# Patient Record
Sex: Female | Born: 1965 | Race: White | Hispanic: No | Marital: Married | Smoking: Never smoker
Health system: Southern US, Community
[De-identification: ages and names within clinical notes are randomized; demographics above are authoritative.]

## PROBLEM LIST (undated history)

## (undated) DIAGNOSIS — M2550 Pain in unspecified joint: Secondary | ICD-10-CM

## (undated) DIAGNOSIS — M949 Disorder of cartilage, unspecified: Secondary | ICD-10-CM

## (undated) DIAGNOSIS — R42 Dizziness and giddiness: Secondary | ICD-10-CM

## (undated) DIAGNOSIS — M899 Disorder of bone, unspecified: Secondary | ICD-10-CM

## (undated) DIAGNOSIS — Z1589 Genetic susceptibility to other disease: Secondary | ICD-10-CM

## (undated) DIAGNOSIS — R319 Hematuria, unspecified: Secondary | ICD-10-CM

## (undated) DIAGNOSIS — M1990 Unspecified osteoarthritis, unspecified site: Secondary | ICD-10-CM

## (undated) DIAGNOSIS — M8580 Other specified disorders of bone density and structure, unspecified site: Secondary | ICD-10-CM

## (undated) DIAGNOSIS — R945 Abnormal results of liver function studies: Secondary | ICD-10-CM

## (undated) DIAGNOSIS — Z8619 Personal history of other infectious and parasitic diseases: Secondary | ICD-10-CM

## (undated) DIAGNOSIS — H1870 Unspecified corneal deformity: Secondary | ICD-10-CM

## (undated) DIAGNOSIS — D72829 Elevated white blood cell count, unspecified: Secondary | ICD-10-CM

## (undated) DIAGNOSIS — K589 Irritable bowel syndrome without diarrhea: Secondary | ICD-10-CM

## (undated) DIAGNOSIS — M459 Ankylosing spondylitis of unspecified sites in spine: Secondary | ICD-10-CM

## (undated) DIAGNOSIS — M359 Systemic involvement of connective tissue, unspecified: Secondary | ICD-10-CM

## (undated) HISTORY — DX: Disorder of bone, unspecified: M89.9

## (undated) HISTORY — DX: Dizziness and giddiness: R42

## (undated) HISTORY — DX: Other specified disorders of bone density and structure, unspecified site: M85.80

## (undated) HISTORY — DX: Abnormal results of liver function studies: R94.5

## (undated) HISTORY — DX: Personal history of other infectious and parasitic diseases: Z86.19

## (undated) HISTORY — DX: Pain in unspecified joint: M25.50

## (undated) HISTORY — DX: Unspecified corneal deformity: H18.70

## (undated) HISTORY — DX: Unspecified osteoarthritis, unspecified site: M19.90

## (undated) HISTORY — DX: Disorder of cartilage, unspecified: M94.9

## (undated) HISTORY — DX: Elevated white blood cell count, unspecified: D72.829

## (undated) HISTORY — DX: Hematuria, unspecified: R31.9

## (undated) HISTORY — DX: Irritable bowel syndrome without diarrhea: K58.9

## (undated) HISTORY — DX: Genetic susceptibility to other disease: Z15.89

## (undated) HISTORY — DX: Ankylosing spondylitis of unspecified sites in spine: M45.9

---
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
## 1993-06-27 HISTORY — PX: GALLBLADDER SURGERY: SHX652

## 1998-09-22 ENCOUNTER — Other Ambulatory Visit: Admission: RE | Admit: 1998-09-22 | Discharge: 1998-09-22 | Payer: Self-pay | Admitting: Obstetrics and Gynecology

## 1999-10-05 ENCOUNTER — Other Ambulatory Visit: Admission: RE | Admit: 1999-10-05 | Discharge: 1999-10-05 | Payer: Self-pay | Admitting: Obstetrics and Gynecology

## 2001-06-05 ENCOUNTER — Other Ambulatory Visit: Admission: RE | Admit: 2001-06-05 | Discharge: 2001-06-05 | Payer: Self-pay | Admitting: Obstetrics and Gynecology

## 2002-06-27 HISTORY — PX: VAGINAL HYSTERECTOMY: SUR661

## 2002-08-09 ENCOUNTER — Other Ambulatory Visit: Admission: RE | Admit: 2002-08-09 | Discharge: 2002-08-09 | Payer: Self-pay | Admitting: Obstetrics and Gynecology

## 2002-12-18 ENCOUNTER — Encounter (INDEPENDENT_AMBULATORY_CARE_PROVIDER_SITE_OTHER): Payer: Self-pay | Admitting: *Deleted

## 2002-12-18 ENCOUNTER — Observation Stay (HOSPITAL_COMMUNITY): Admission: RE | Admit: 2002-12-18 | Discharge: 2002-12-19 | Payer: Self-pay | Admitting: Obstetrics and Gynecology

## 2004-12-09 ENCOUNTER — Other Ambulatory Visit: Admission: RE | Admit: 2004-12-09 | Discharge: 2004-12-09 | Payer: Self-pay | Admitting: Obstetrics and Gynecology

## 2006-03-22 ENCOUNTER — Ambulatory Visit: Payer: Self-pay | Admitting: Obstetrics & Gynecology

## 2007-03-28 ENCOUNTER — Ambulatory Visit: Payer: Self-pay

## 2009-05-07 ENCOUNTER — Ambulatory Visit: Payer: Self-pay | Admitting: Family Medicine

## 2009-07-16 ENCOUNTER — Ambulatory Visit: Payer: Self-pay | Admitting: Rheumatology

## 2009-09-08 ENCOUNTER — Encounter: Payer: Self-pay | Admitting: Physician Assistant

## 2009-09-25 ENCOUNTER — Encounter: Payer: Self-pay | Admitting: Physician Assistant

## 2009-10-01 DIAGNOSIS — R945 Abnormal results of liver function studies: Secondary | ICD-10-CM

## 2009-10-01 HISTORY — DX: Abnormal results of liver function studies: R94.5

## 2009-10-25 ENCOUNTER — Encounter: Payer: Self-pay | Admitting: Physician Assistant

## 2009-11-25 ENCOUNTER — Encounter: Payer: Self-pay | Admitting: Physician Assistant

## 2009-12-25 ENCOUNTER — Encounter: Payer: Self-pay | Admitting: Physician Assistant

## 2010-01-25 ENCOUNTER — Encounter: Payer: Self-pay | Admitting: Physician Assistant

## 2010-11-12 NOTE — Discharge Summary (Signed)
   NAME:  Kendra Cameron, Kendra Cameron                       ACCOUNT NO.:  [id number]   MEDICAL RECORD NO.:  [id number]                   PATIENT TYPE:  OBV   LOCATION:  9310                                 FACILITY:  [facility]   PHYSICIAN:  Juluis Mire, M.D.                DATE OF BIRTH:  [birth year]   DATE OF ADMISSION:  12/18/2002  DATE OF DISCHARGE:  12/19/2002                                 DISCHARGE SUMMARY   ADMISSION DIAGNOSIS:  Menorrhagia and dysmenorrhea with known uterine  fibroids.   DISCHARGE DIAGNOSIS:  Menorrhagia and dysmenorrhea with known uterine  fibroids.   PATHOLOGY:  Pending.   OPERATIVE PROCEDURE:  Laparoscopically assisted vaginal hysterectomy.   HISTORY AND PHYSICAL:  For complete history and physical, please see  dictated note.   HOSPITAL COURSE:  The patient underwent above-noted surgery. There was no  evidence of pelvic pathology beyond the uterine fibroids. Pathology on the  surgical specimen is still pending. Postop hemoglobin was 9.6. Discharged  home on her first postop day. At that time, she was tolerating liquids. Her  abdomen was soft and nontender. She had normal bowel sounds. All incisions  were intact. She had no active vaginal bleeding. Urine output was adequate.   COMPLICATIONS:  None encountered during her stay in the hospital.   CONDITION ON DISCHARGE:  The patient is discharged home in stable condition.   DISCHARGE INSTRUCTIONS:  The patient is discharged home [place] in case she  needs it for pain. She is to avoid heavy lifting, vaginal entry, or driving  the car. She is to call with fever, nausea and vomiting, increasing  abdominal pain, or active vaginal bleeding.   FOLLOW UP:  In the office in one week.                                               Juluis Mire, M.D.    JSM/MEDQ  D:  12/19/2002  T:  12/21/2002  Job:  [id number]

## 2010-11-12 NOTE — H&P (Signed)
NAME:  Kendra Cameron, Kendra Cameron                       ACCOUNT NO.:  [id number]   MEDICAL RECORD NO.:  [id number]                   PATIENT TYPE:  AMB   LOCATION:  SDC                                  FACILITY:  [facility]   PHYSICIAN:  Juluis Mire, M.D.                DATE OF BIRTH:  08-05-1965   DATE OF ADMISSION:  12/18/2002  DATE OF DISCHARGE:                                HISTORY & PHYSICAL   The patient is a 45 year old, gravida 2, para 2, married white female who  presents for a laparoscopically-assisted vaginal hysterectomy.   In relation to the present admission, the patient has a longstanding history  of menstrual abnormalities.  Her cycles are regular at the present time.  She describes six days of flow, three to four days are heavy, changing pads  and tampons every two to three hours.  Associated with these periods are  clots.  She is also having increasing dysmenorrhea.  She is sexually active  with increasing pain during deep penetration.  She had been tried on birth  control pills without response.  Prior ultrasounds have been unremarkable  except for a small fibroid.  Options have been discussed, including the  further use of hormonal agents versus outpatient laparoscopy/hysteroscopy  versus vaginal hysterectomy.  The patient's plans for the present time are  for definitive surgical management.   ALLERGIES:  SULFA.   MEDICATIONS:  Include calcium.   PAST MEDICAL HISTORY:  Usual childhood diseases.  No significant sequelae.   PAST SURGICAL HISTORY:  The patient has had no previous surgical history.   OBSTETRICAL HISTORY:  Two spontaneous vaginal deliveries.   FAMILY HISTORY:  Noncontributory.   SOCIAL HISTORY:  No tobacco or alcohol use.   REVIEW OF SYSTEMS:  Noncontributory.   PHYSICAL EXAMINATION:  VITAL SIGNS:  The patient is afebrile with stable  vital signs.  HEENT EXAM:  The patient is normocephalic.  Pupils are round and reactive to  light and accommodation.   Extraocular movements are intact.  Sclerae and  conjunctivae are clear.  Oropharynx clear.  NECK:  Without thyromegaly.  BREASTS:  No discrete masses.  LUNGS:  Clear.  CARDIAC:  Regular rate and rhythm without murmurs or gallops.  ABDOMINAL EXAM:  Benign.  No mass, organomegaly or tenderness.  PELVIC:  Normal external genitalia.  Vaginal mucosa clear.  Cervix  unremarkable.  Uterus is supple and of normal size.  Adnexa unremarkable.  EXTREMITIES:  Trace edema.  NEUROLOGIC EXAM:  Grossly within normal limits.   IMPRESSION:  Menorrhagia and dysmenorrhea.  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   PLAN:  The patient will undergo laparoscopically-assisted vaginal  hysterectomy.  The risks of surgery have been discussed, including risk of  infection.  The risk of hemorrhage could necessitate transfusion with the  risk of AIDS or hepatitis.  Risk of injury to adjacent organs, including  bladder, bowel or ureters, that could require  further exploratory surgery.  Risk of deep venous thrombosis and pulmonary embolus.  The patient expressed  an understanding of the indications and risks.                                               Juluis Mire, M.D.    JSM/MEDQ  D:  12/18/2002  T:  12/18/2002  Job:  [id number]

## 2010-11-12 NOTE — Op Note (Signed)
NAME:  Kendra Cameron, Kendra Cameron                       ACCOUNT NO.:  [id number]   MEDICAL RECORD NO.:  [id number]                   PATIENT TYPE:  OBV   LOCATION:  9399                                 FACILITY:  [facility]   PHYSICIAN:  Juluis Mire, M.D.                DATE OF BIRTH:  September 05, 1965   DATE OF PROCEDURE:  12/18/2002  DATE OF DISCHARGE:                                 OPERATIVE REPORT   PREOPERATIVE DIAGNOSES:  1. Menorrhagia with dysmenorrhea.  2. Known uterine fibroids.   POSTOPERATIVE DIAGNOSES:  1. Menorrhagia with dysmenorrhea.  2. Known uterine fibroids.  3. Pathology pending.   OPERATION:  Laparoscopically assisted vaginal hysterectomy.   SURGEON:  Juluis Mire, M.D.   ANESTHESIA:  General endotracheal anesthesia.   ESTIMATED BLOOD LOSS:  200 mL.   PACKS AND DRAINS:  None.   FLUIDS REPLACED:  No intraoperative blood placed.   COMPLICATIONS:  None.   INDICATIONS FOR PROCEDURE:  This is dictated in the history and physical.   DESCRIPTION OF PROCEDURE:  The patient was taken to the operating room and  placed in the supine position.  After satisfactory level of general  endotracheal anesthesia obtained, the patient was placed in the dorsal  lithotomy position using the Allen stirrups.  The abdomen, perineum and  vagina were prepped out with Betadine.  The bladder was emptied by in and  out catheterization.  A Hulka tenaculum was put in place and secured.  The  patient was draped as a sterile field.  Subumbilical incision was made with  the knife.  The fascia was identified and entered sharply.  Incision in the  fascia extended laterally.  Rectus muscles were separated and the perineum  was entered sharply.  The open laparoscopic trocar was put in place.  The  balloon was inflated and secured.  Laparoscope was introduced.  There was no  injury to adjacent organs.  A 5 mm trocar was put in place in the suprapubic  area under direct visualization.  The uterus was  enlarged with multiple  fibroids.  Tubes and ovaries were unremarkable.  Appendix was seen and noted  to be normal.  Upper abdomen including liver were normal.  Gallbladder was  surgically absent.  Using the gyrus bipolar, we went first to the right  side.  The right round ligament was cauterized, incised.  Right tube and  mesosalpinx was cauterized and incised and the right utero-ovarian pedicle  was cauterized and incised.  Good hemostasis was noted.  We then went to the  left side.  The left round ligament was cauterized and incised, the left  tube  and mesosalpinx was cauterized and incised, and the left utero-ovarian  pedicle was cauterized and incised.  At the end, good hemostasis was  noticed.   Laparoscope was removed.  Abdomen was deflated of its carbon dioxide.  Weights were repositioned.  The Hulka tenaculum was then  removed.  The  weighted speculum was placed in the vaginal vault.  The cervix was grasped  with a Jacob's tenaculum.  Cul-de-sac was entered sharply.  Uterosacral  ligaments were clamped, cut, and suture ligated with 0 Vicryl.  The  reflection of the vaginal mucosa was incised.  Bladder was dissected  superiorly.  Paracervical tissue was clamped, cut, and suture ligated with 0  Vicryl.  Vesicouterine space was entered.  Retractor was put in place to  retract superiorly.  Using the clamped, cut and tie technique with suture  ligature of 0 Vicryl, the perimetrium was serially separated beside the  uterus.  The uterus was then flipped and the remaining pedicles were clamped  and cut.  The uterus was removed from the operative field.  Held pedicle was  secured with free ties of 0 Vicryl.  The uterosacral plication stitch of 0  Vicryl was put in place an secured.  The vaginal mucosa was reapproximated  in the midline with figure-of-eights of 0 Vicryl.  Sponge on a sponge stick  was placed in the vaginal vault.  Foley was placed to straight drain and  revealed adequate  amount of clear urine.   Abdomen was reinflated with carbon dioxide.  Visualization with the  laparoscope revealed all pedicles to be hemostatically intact.  We irrigated  the pelvis and again no bleeding was encountered.  All trocars were removed.  Subumbilical fascia was closed with figure-of-eights of 0 Vicryl, skin with  interrupted subcuticulars of 4-0 Vicryl, the suprapubic incision was closed  with Steri-Strips.  Urine output remained clear and adequate.  Sponge,  needle and instrument counts were reported as correct by the circulating  nurse x2.  The patient, once extubated, was transferred to the recovery room  in good condition.                                               Juluis Mire, M.D.    JSM/MEDQ  D:  12/18/2002  T:  12/18/2002  Job:  [id number]

## 2012-11-29 ENCOUNTER — Encounter: Payer: Self-pay | Admitting: *Deleted

## 2016-12-25 ENCOUNTER — Encounter: Payer: Self-pay | Admitting: Emergency Medicine

## 2016-12-25 ENCOUNTER — Emergency Department: Payer: BC Managed Care – PPO

## 2016-12-25 ENCOUNTER — Emergency Department
Admission: EM | Admit: 2016-12-25 | Discharge: 2016-12-25 | Disposition: A | Discharge status: Home / Self Care | Payer: BC Managed Care – PPO | Attending: Emergency Medicine | Admitting: Emergency Medicine

## 2016-12-25 DIAGNOSIS — M542 Cervicalgia: Secondary | ICD-10-CM | POA: Diagnosis not present

## 2016-12-25 DIAGNOSIS — Z79899 Other long term (current) drug therapy: Secondary | ICD-10-CM | POA: Insufficient documentation

## 2016-12-25 DIAGNOSIS — R079 Chest pain, unspecified: Secondary | ICD-10-CM

## 2016-12-25 DIAGNOSIS — R05 Cough: Secondary | ICD-10-CM | POA: Insufficient documentation

## 2016-12-25 DIAGNOSIS — R0602 Shortness of breath: Secondary | ICD-10-CM | POA: Insufficient documentation

## 2016-12-25 LAB — CBC
HCT: 40.9 % (ref 35.0–47.0)
HEMOGLOBIN: 13.7 g/dL (ref 12.0–16.0)
MCH: 27 pg (ref 26.0–34.0)
MCHC: 33.5 g/dL (ref 32.0–36.0)
MCV: 80.8 fL (ref 80.0–100.0)
Platelets: 358 10*3/uL (ref 150–440)
RBC: 5.06 MIL/uL (ref 3.80–5.20)
RDW: 14.2 % (ref 11.5–14.5)
WBC: 15.4 10*3/uL — AB (ref 3.6–11.0)

## 2016-12-25 LAB — TROPONIN I

## 2016-12-25 LAB — BASIC METABOLIC PANEL
Anion gap: 7 (ref 5–15)
BUN: 13 mg/dL (ref 6–20)
CALCIUM: 9.6 mg/dL (ref 8.9–10.3)
CHLORIDE: 103 mmol/L (ref 101–111)
CO2: 28 mmol/L (ref 22–32)
Creatinine, Ser: 0.67 mg/dL (ref 0.44–1.00)
GFR calc Af Amer: >60 mL/min (ref >60)
Glucose, Bld: 103 mg/dL — ABNORMAL HIGH (ref 65–99)
POTASSIUM: 4 mmol/L (ref 3.5–5.1)
Sodium: 138 mmol/L (ref 135–145)

## 2016-12-25 NOTE — ED Provider Notes (Signed)
[facility] Emergency Department Provider Note ____________________________________________   I have reviewed the triage vital signs and the triage nursing note.  HISTORY  Chief Complaint Chest Pain   Historian Patient, daughter and husband at bedside  HPI Kendra Cameron is a 51 y.o. 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 She had onset when she was eating [place] food and thought she might be having indigestion type symptoms but it lasted a little longer than typical. She's also been having spasms and twitching that she noticed in the left side of her neck where she typically has some chronic pain and tense muscles.  No vomiting.  Pain currently mild. There has been some waxing and waning.   Past Medical History:  Diagnosis Date  . Arthralgia   . Bone/cartilage disorder   . Corneal deformity   . Dizziness   . Hematuria   . History of chicken pox   . HLA B27 (HLA B27 positive)   . IBS (irritable bowel syndrome)   . Leukocytosis   . Nonspecific abnormal results of function study of liver [id number]  . Osteoarthrosis   . Osteopenia   . 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   Past Surgical History:  Procedure Laterality Date  . GALLBLADDER SURGERY  1995  . TONSILLECTOMY  1986  . VAGINAL HYSTERECTOMY  2004   Fibroids Ovaries intact [place]    Prior to Admission medications   Medication Sig Start Date End Date Taking? Authorizing Provider  Calcium Carbonate-Vit D-Min (CALCIUM 1200 PO) Take 1,200 mg by mouth daily.    [provider]  hydroxychloroquine (PLAQUENIL) 200 MG tablet Take 200 mg by mouth 2 (two) times daily.    [provider]    Allergies  Allergen Reactions  . Sulfa  Antibiotics     Family History  Problem Relation Age of Onset  . Osteoporosis Mother   . Hypothyroidism Mother   . Cancer Father     Social History Social History  Substance Use Topics  . Smoking status: Never Smoker  . Smokeless tobacco: Never Used  . Alcohol use Yes     Comment: occasional    Review of Systems  Constitutional: Negative for fever. Eyes: Negative for visual changes. ENT: Negative for sore throat. Cardiovascular: Positive for chest pain. Respiratory: Occasional mild shortness breath. No pleuritic chest pain. Mild dry cough. Gastrointestinal: Negative for abdominal pain, vomiting and diarrhea. Genitourinary: Negative for dysuria. Musculoskeletal: Negative for back pain. Skin: Negative for rash. Neurological: Negative for headache.  ____________________________________________   PHYSICAL EXAM:  VITAL SIGNS: ED Triage Vitals  Enc Vitals Group     BP 12/25/16 1523 (!) 142/80     Pulse Rate 12/25/16 1523 95     Resp 12/25/16 1523 16     Temp 12/25/16 1523 98.1 F (36.7 C)     Temp Source 12/25/16 1523 Oral     SpO2 12/25/16 1523 98 %     Weight 12/25/16 1523 159 lb (72.1 kg)     Height 12/25/16 1523 '5\' 2"'  (1.575 m)     Head Circumference --      Peak Flow --      Pain Score 12/25/16 1522 6     Pain Loc --  Pain Edu? --      Excl. in [place]? --      Constitutional: Alert and oriented. Well appearing and in no distress. HEENT   Head: Normocephalic and atraumatic.      Eyes: Conjunctivae are normal. Pupils equal and round.       Ears:         Nose: No congestion/rhinnorhea.   Mouth/Throat: Mucous membranes are moist.   Neck: No stridor. Cardiovascular/Chest: Normal rate, regular rhythm.  No murmurs, rubs, or gallops. Respiratory: Normal respiratory effort without tachypnea nor retractions. Breath sounds are clear and equal bilaterally. No wheezes/rales/rhonchi. Gastrointestinal: Soft. No distention, no guarding, no rebound.  Nontender.   Genitourinary/rectal:Deferred Musculoskeletal: Nontender with normal range of motion in all extremities. No joint effusions.  No lower extremity tenderness.  No edema. Neurologic:  Normal speech and language. No gross or focal neurologic deficits are appreciated. Skin:  Skin is warm, dry and intact. No rash noted. Psychiatric: Mood and affect are normal. Speech and behavior are normal. Patient exhibits appropriate insight and judgment.   ____________________________________________  LABS (pertinent positives/negatives)  Labs Reviewed  BASIC METABOLIC PANEL - Abnormal; Notable for the following:       Result Value   Glucose, Bld 103 (*)    All other components within normal limits  CBC - Abnormal; Notable for the following:    WBC 15.4 (*)    All other components within normal limits  TROPONIN I  TROPONIN I    ____________________________________________    EKG I, Lisa Roca, MD, the attending physician have personally viewed and interpreted all ECGs.  97 bpm. Normal sinus rhythm. Narrow QRS. Normal axis. Nonspecific ST and T-wave ____________________________________________  RADIOLOGY All Xrays were viewed by me. Imaging interpreted by Radiologist.  Chest x-ray two-view: No acute cardio pulmonary findings. __________________________________________  PROCEDURES  Procedure(s) performed: None  Critical Care performed: None  ____________________________________________   ED COURSE / ASSESSMENT AND PLAN  Pertinent labs & imaging results that were available during my care of the patient were reviewed by me and considered in my medical decision making (see chart for details).  Ms. Kendra Cameron is here for chest discomfort as well as neck discomfort. The neck discomfort seems to be due to tense muscle spasms. She states that she's tried Flexeril in the past as well as massage, and nothing is really helped and she thinks it's more associated with her chronic  autoimmune pains.  The chest discomfort seems to be nonspecific, but her EKG and troponin and clinical evaluation are all reassuring. I'm less suspicious for ACS. I discussed with her still referring her for cardiology follow-up.  Symptoms seem most likely consistent with gastritis/GERD, we discussed symptomatic conservative treatment with respect to this as well.    CONSULTATIONS:   None   Patient / Family / Caregiver informed of clinical course, medical decision-making process, and agree with plan.   I discussed return precautions, follow-up instructions, and discharge instructions with patient and/or family.  Discharge Instructions : You were evaluated for chest discomfort, and as we discussed although no certain cause was found, and your exam and evaluation are overall reassuring in the emergency department today.  I think your chest discomfort may be coming from acid indigestion. You may try Prilosec over-the-counter once daily for 2 weeks, avoiding citrus and spicy foods for 2 weeks.  I am recommending you follow-up with a cardiologist although your exam and evaluation are reassuring today in the emergency department.  As we discussed, your symptoms  do not seem consistent with pulmonary embolism, also noticed blood clot, and we decided against further investigation with CT scan the chest today.  Please return to the emergency department immediately for any worsening symptoms including new or worsening chest pain, sweats, nausea, vomiting blood, black or bloody stool, dizziness or passing out, shortness of breath, pain with breathing, or any other symptoms concerning to you.    ___________________________________________   FINAL CLINICAL IMPRESSION(S) / ED DIAGNOSES   Final diagnoses:  Nonspecific chest pain              Note: This dictation was prepared with Dragon dictation. Any transcriptional errors that result from this process are unintentional    Lisa Roca, MD 12/25/16 2128

## 2016-12-25 NOTE — ED Notes (Signed)

## 2016-12-25 NOTE — Discharge Instructions (Signed)
You were evaluated for chest discomfort, and as we discussed although no certain cause was found, and your exam and evaluation are overall reassuring in the emergency department today.  I think your chest discomfort may be coming from acid indigestion. You may try Prilosec over-the-counter once daily for 2 weeks, avoiding citrus and spicy foods for 2 weeks.  I am recommending you follow-up with a cardiologist although your exam and evaluation are reassuring today in the emergency department.  As we discussed, your symptoms do not seem consistent with pulmonary embolism, also noticed blood clot, and we decided against further investigation with CT scan the chest today.  Please return to the emergency department immediately for any worsening symptoms including new or worsening chest pain, sweats, nausea, vomiting blood, black or bloody stool, dizziness or passing out, shortness of breath, pain with breathing, or any other symptoms concerning to you.

## 2016-12-26 ENCOUNTER — Telehealth: Payer: Self-pay

## 2016-12-26 NOTE — Telephone Encounter (Signed)
Lmov for patient to call back  Seen in ED on 12/25/16 for CP Will try again at a later time

## 2017-01-03 NOTE — Telephone Encounter (Signed)
Lmov for patient to call back  Seen in ED on 12/25/16 for CP Will try again at a later time

## 2017-01-05 NOTE — Telephone Encounter (Signed)
Unable to contact °Sent letter  °

## 2018-12-31 ENCOUNTER — Other Ambulatory Visit: Payer: Self-pay | Admitting: Obstetrics & Gynecology

## 2018-12-31 DIAGNOSIS — Z8262 Family history of osteoporosis: Secondary | ICD-10-CM

## 2018-12-31 DIAGNOSIS — Z1231 Encounter for screening mammogram for malignant neoplasm of breast: Secondary | ICD-10-CM

## 2019-01-21 ENCOUNTER — Inpatient Hospital Stay: Admission: RE | Admit: 2019-01-21 | Payer: BC Managed Care – PPO | Source: Ambulatory Visit

## 2019-02-25 ENCOUNTER — Ambulatory Visit
Admission: RE | Admit: 2019-02-25 | Discharge: 2019-02-25 | Disposition: A | Discharge status: Home / Self Care | Payer: BC Managed Care – PPO | Source: Ambulatory Visit | Attending: Obstetrics & Gynecology | Admitting: Obstetrics & Gynecology

## 2019-02-25 DIAGNOSIS — Z8262 Family history of osteoporosis: Secondary | ICD-10-CM

## 2019-02-25 DIAGNOSIS — Z1231 Encounter for screening mammogram for malignant neoplasm of breast: Secondary | ICD-10-CM | POA: Diagnosis present

## 2019-09-18 ENCOUNTER — Other Ambulatory Visit: Payer: Self-pay | Admitting: Gastroenterology

## 2019-09-18 DIAGNOSIS — R197 Diarrhea, unspecified: Secondary | ICD-10-CM

## 2019-09-18 DIAGNOSIS — R1032 Left lower quadrant pain: Secondary | ICD-10-CM

## 2019-09-19 ENCOUNTER — Other Ambulatory Visit: Payer: Self-pay

## 2019-09-19 ENCOUNTER — Ambulatory Visit
Admission: RE | Admit: 2019-09-19 | Discharge: 2019-09-19 | Disposition: A | Discharge status: Home / Self Care | Payer: BC Managed Care – PPO | Source: Ambulatory Visit | Attending: Gastroenterology | Admitting: Gastroenterology

## 2019-09-19 DIAGNOSIS — R197 Diarrhea, unspecified: Secondary | ICD-10-CM | POA: Diagnosis present

## 2019-09-19 DIAGNOSIS — R1032 Left lower quadrant pain: Secondary | ICD-10-CM | POA: Diagnosis present

## 2019-09-19 HISTORY — DX: Systemic involvement of connective tissue, unspecified: M35.9

## 2019-09-19 MED ORDER — IOHEXOL 300 MG/ML  SOLN
100.0000 mL | Freq: Once | INTRAMUSCULAR | Status: AC | PRN
  Administered 2019-09-19: 12:00:00 100 mL via INTRAVENOUS

## 2020-12-18 ENCOUNTER — Other Ambulatory Visit: Payer: Self-pay | Admitting: Obstetrics and Gynecology

## 2020-12-18 DIAGNOSIS — Z1231 Encounter for screening mammogram for malignant neoplasm of breast: Secondary | ICD-10-CM

## 2021-01-01 ENCOUNTER — Other Ambulatory Visit: Payer: Self-pay

## 2021-01-01 ENCOUNTER — Ambulatory Visit
Admission: RE | Admit: 2021-01-01 | Discharge: 2021-01-01 | Disposition: A | Discharge status: Home / Self Care | Payer: BC Managed Care – PPO | Source: Ambulatory Visit | Attending: Obstetrics and Gynecology | Admitting: Obstetrics and Gynecology

## 2021-01-01 DIAGNOSIS — Z1231 Encounter for screening mammogram for malignant neoplasm of breast: Secondary | ICD-10-CM | POA: Diagnosis present

## 2021-01-08 ENCOUNTER — Other Ambulatory Visit: Payer: Self-pay | Admitting: Obstetrics and Gynecology

## 2021-01-11 ENCOUNTER — Other Ambulatory Visit: Payer: Self-pay | Admitting: Obstetrics and Gynecology

## 2021-01-11 DIAGNOSIS — N6320 Unspecified lump in the left breast, unspecified quadrant: Secondary | ICD-10-CM

## 2021-01-11 DIAGNOSIS — R928 Other abnormal and inconclusive findings on diagnostic imaging of breast: Secondary | ICD-10-CM

## 2021-01-18 ENCOUNTER — Ambulatory Visit
Admission: RE | Admit: 2021-01-18 | Discharge: 2021-01-18 | Disposition: A | Discharge status: Home / Self Care | Payer: BC Managed Care – PPO | Source: Ambulatory Visit | Attending: Obstetrics and Gynecology | Admitting: Obstetrics and Gynecology

## 2021-01-18 ENCOUNTER — Other Ambulatory Visit: Payer: Self-pay

## 2021-01-18 DIAGNOSIS — N6320 Unspecified lump in the left breast, unspecified quadrant: Secondary | ICD-10-CM

## 2021-01-18 DIAGNOSIS — R928 Other abnormal and inconclusive findings on diagnostic imaging of breast: Secondary | ICD-10-CM | POA: Insufficient documentation

## 2021-01-20 ENCOUNTER — Other Ambulatory Visit: Payer: Self-pay | Admitting: Obstetrics and Gynecology

## 2021-01-20 DIAGNOSIS — R928 Other abnormal and inconclusive findings on diagnostic imaging of breast: Secondary | ICD-10-CM

## 2021-03-24 IMAGING — MG MM DIGITAL SCREENING BILAT W/ TOMO W/ CAD
8 series · 8 of 24 positions shown · non-contrast
Comparison: Previous exam(s).

CLINICAL DATA: Screening.

EXAM:
DIGITAL SCREENING BILATERAL MAMMOGRAM WITH TOMO AND CAD

[L CC synth-2D]
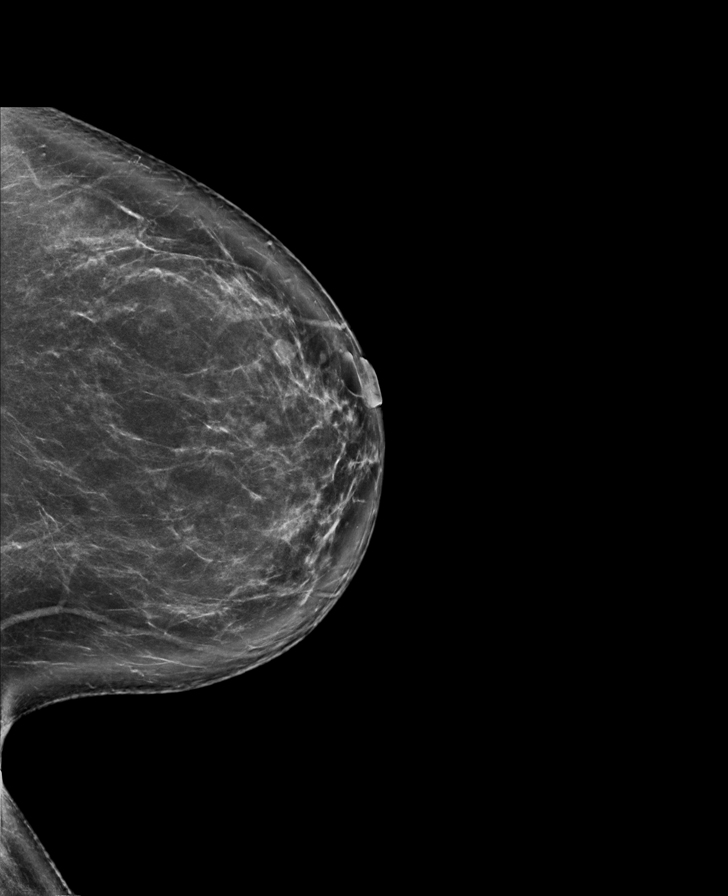

[L MLO synth-2D]
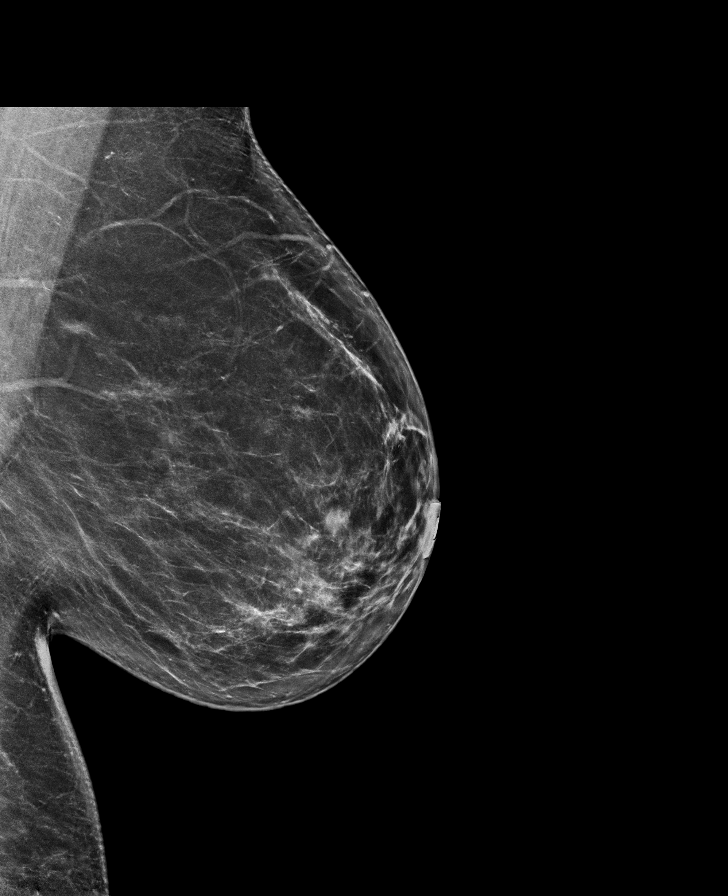

[R CC synth-2D]
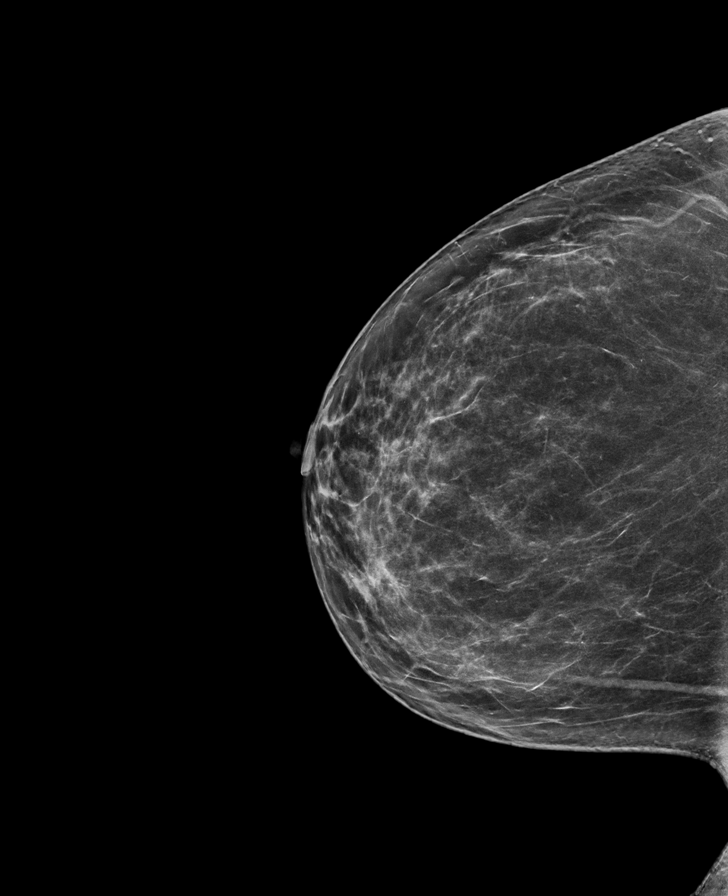

[R MLO synth-2D]
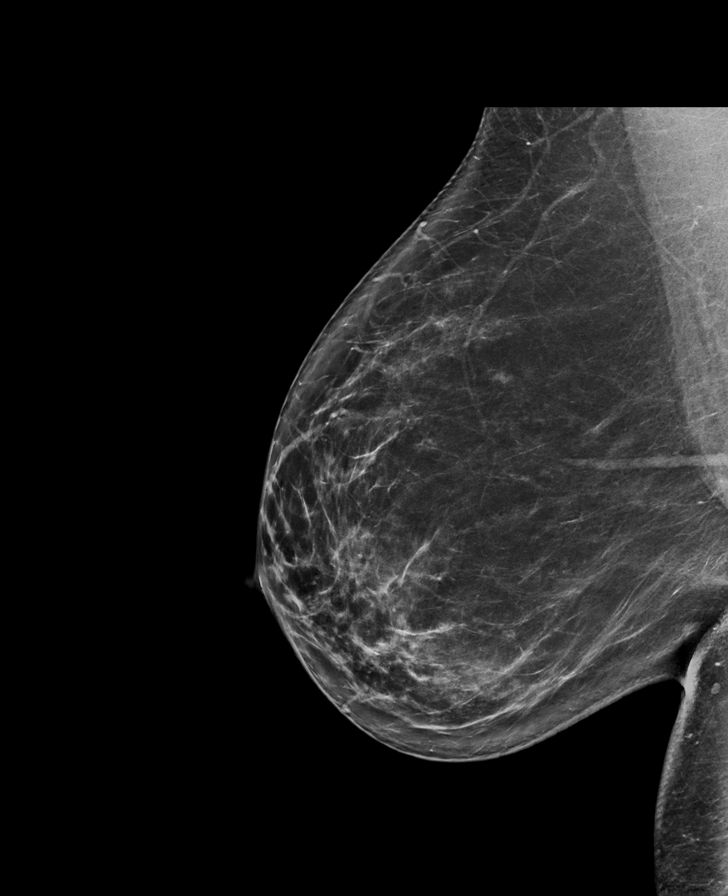

[R CC tomo · tomo slice 37/74.0]
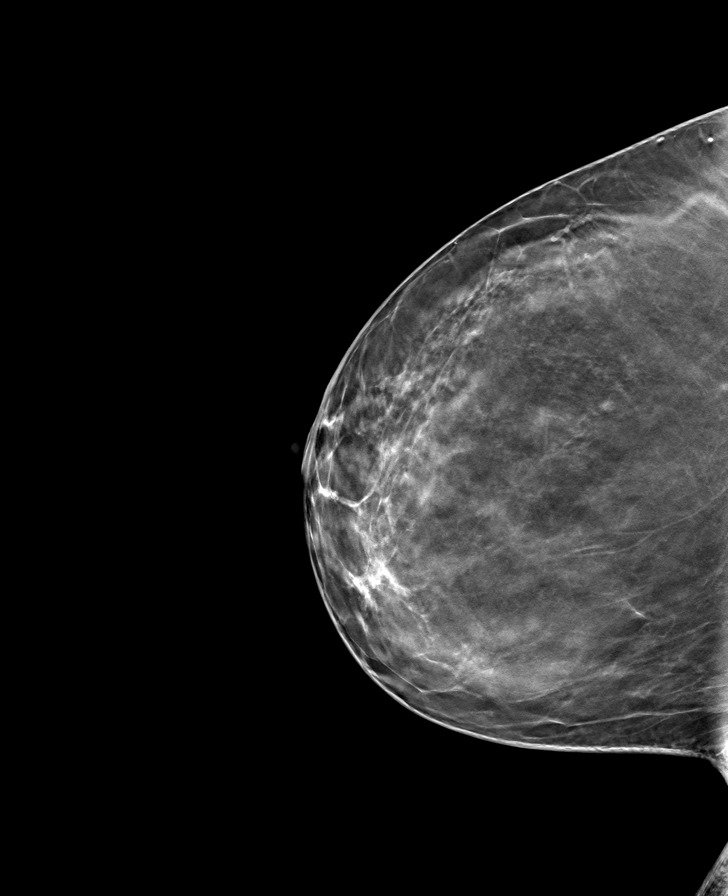

[L MLO tomo · tomo slice 43/85.0]
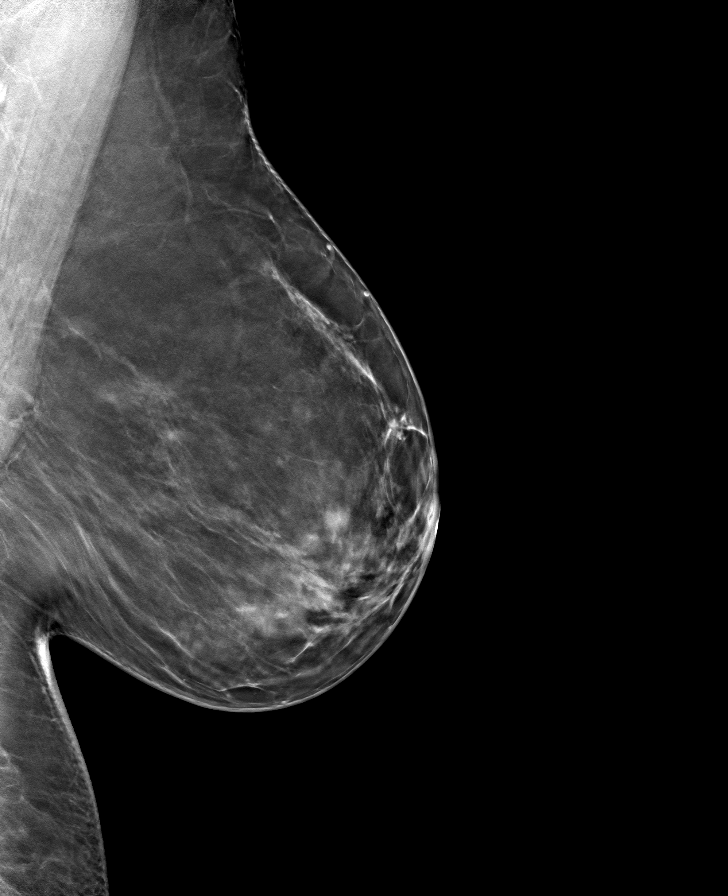

[R MLO tomo · tomo slice 45/90.0]
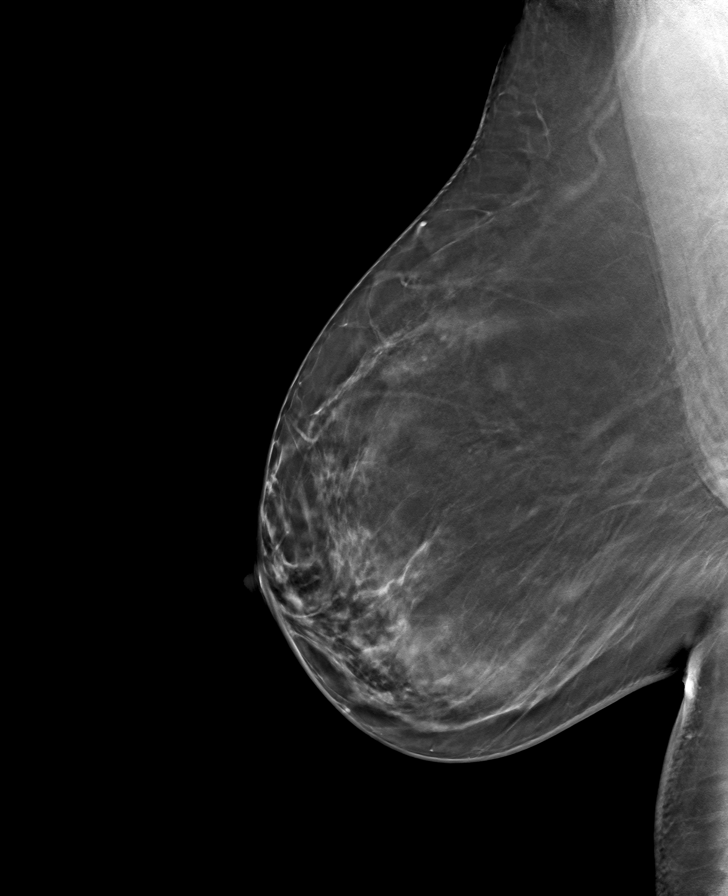

[L CC tomo · tomo slice 49/96.0]
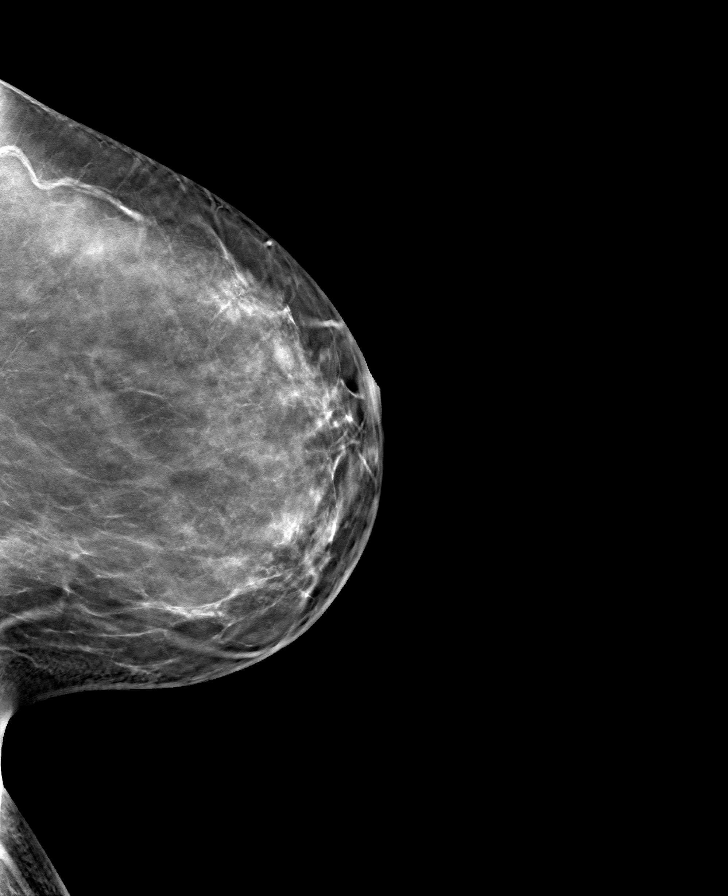

[8 of 24 positions shown; findings below may reference images not displayed]

ACR Breast Density Category b: There are scattered areas of
fibroglandular density.
FINDINGS: There are no findings suspicious for malignancy. Images were
processed with CAD.
IMPRESSION: No mammographic evidence of malignancy. A result letter of this
screening mammogram will be mailed directly to the patient.

RECOMMENDATION:
Screening mammogram in one year. (Code:[id number])

BI-RADS CATEGORY  1: Negative.

## 2021-07-07 ENCOUNTER — Other Ambulatory Visit: Payer: Self-pay | Admitting: Gastroenterology

## 2021-07-07 DIAGNOSIS — K52831 Collagenous colitis: Secondary | ICD-10-CM

## 2021-07-07 DIAGNOSIS — R1011 Right upper quadrant pain: Secondary | ICD-10-CM

## 2024-02-05 ENCOUNTER — Other Ambulatory Visit: Payer: Self-pay | Admitting: Family Medicine

## 2024-02-05 DIAGNOSIS — E7800 Pure hypercholesterolemia, unspecified: Secondary | ICD-10-CM

## 2024-02-05 DIAGNOSIS — R7303 Prediabetes: Secondary | ICD-10-CM

## 2024-02-05 DIAGNOSIS — I10 Essential (primary) hypertension: Secondary | ICD-10-CM

## 2024-02-07 ENCOUNTER — Ambulatory Visit
Admission: RE | Admit: 2024-02-07 | Discharge: 2024-02-07 | Disposition: A | Discharge status: Home / Self Care | Payer: Self-pay | Source: Ambulatory Visit | Attending: Family Medicine | Admitting: Family Medicine

## 2024-02-07 DIAGNOSIS — I10 Essential (primary) hypertension: Secondary | ICD-10-CM | POA: Insufficient documentation

## 2024-02-07 DIAGNOSIS — R7303 Prediabetes: Secondary | ICD-10-CM | POA: Insufficient documentation

## 2024-02-07 DIAGNOSIS — E7800 Pure hypercholesterolemia, unspecified: Secondary | ICD-10-CM | POA: Insufficient documentation
